# Patient Record
Sex: Male | Born: 1960
Health system: Southern US, Community
[De-identification: ages and names within clinical notes are randomized; demographics above are authoritative.]

## PROBLEM LIST (undated history)

## (undated) DIAGNOSIS — T7840XA Allergy, unspecified, initial encounter: Secondary | ICD-10-CM

## (undated) HISTORY — DX: Allergy, unspecified, initial encounter: T78.40XA

## (undated) HISTORY — PX: CYST REMOVAL TRUNK: SHX6283

---

## 2000-12-25 ENCOUNTER — Ambulatory Visit (HOSPITAL_BASED_OUTPATIENT_CLINIC_OR_DEPARTMENT_OTHER): Admission: RE | Admit: 2000-12-25 | Discharge: 2000-12-25 | Payer: Self-pay | Admitting: *Deleted

## 2009-04-14 ENCOUNTER — Ambulatory Visit (HOSPITAL_COMMUNITY): Admission: RE | Admit: 2009-04-14 | Discharge: 2009-04-14 | Payer: Self-pay | Admitting: Family Medicine

## 2010-09-01 NOTE — Op Note (Signed)
[place]. [facility]  Patient:    CAMEO, SHEWELL Visit Number: [id number] MRN: [id number]          Service Type: DSU Location: [facility] Attending Physician:  Kandis Mannan Proc. Date: 12/25/00 Admit Date:  12/25/2000   CC:         Jethro Bastos, M.D.   Operative Report  CCS NUMBER:  [id number]  PREOPERATIVE DIAGNOSIS:  Perirectal abscess.  POSTOPERATIVE DIAGNOSIS:  Perirectal abscess.  OPERATION:  Incision and drainage with packing of perirectal abscess.  SURGEON:  Maisie Fus B. Samuella Cota, M.D.  ANESTHESIA:  General.  ANESTHESIOLOGIST:  Janetta Hora. Gelene Mink, M.D. and CRNA.  DESCRIPTION OF PROCEDURE:  The patient was taken to the operating room and placed on the table in the supine position.  After satisfactory general anesthetic with LMA intubation, the patient was placed in lithotomy position, and the perianal area was shaved and then prepped with Betadine.  Sterile drapes were applied.  The patient had a swollen area at the 11 oclock position, and pressure over this produced some drainage through an opening at the 11 oclock position.  The anoscope was inserted.  The internal opening was identified.  A radial incision was made over the abscess cavity and once the abscess cavity was entered, a hemostat could be placed through the cavity through the internal opening.  The tract was opened completely.  The edges were freshened up to get back to normal looking tissue around the edge of the abscess.  The abscess cavity extended distally down the leg at about the 11:30 position.  The patient had some granulation tissue in the abscess cavity, and this was removed.  The wound was irrigated.  Bleeding was controlled with the cautery.  The wound was packed open with quarter-inch Iodoform packing.  Then 4 x 4s, ABD, and four-inch Hypafix was applied.  The patient seemed to tolerate the procedure well.  The internal opening seemed to be superficial to the  sphincter muscles, and no sphincter muscles were divided.  The patient seemed to tolerate the procedure well and was taken to the PACU in satisfactory condition. Attending Physician:  Kandis Mannan DD:  12/25/00 TD:  12/25/00 Job: [id number]

## 2010-12-15 IMAGING — CR DG ORBITS FOR FOREIGN BODY
2 series · 2 of 2 positions shown · non-contrast
Comparison: None

CLINICAL DATA: History metal in orbits.  Pre MRI screening.

ORBITS FOR FOREIGN BODY - 2 VIEW

[w waters * (1 of 2)]
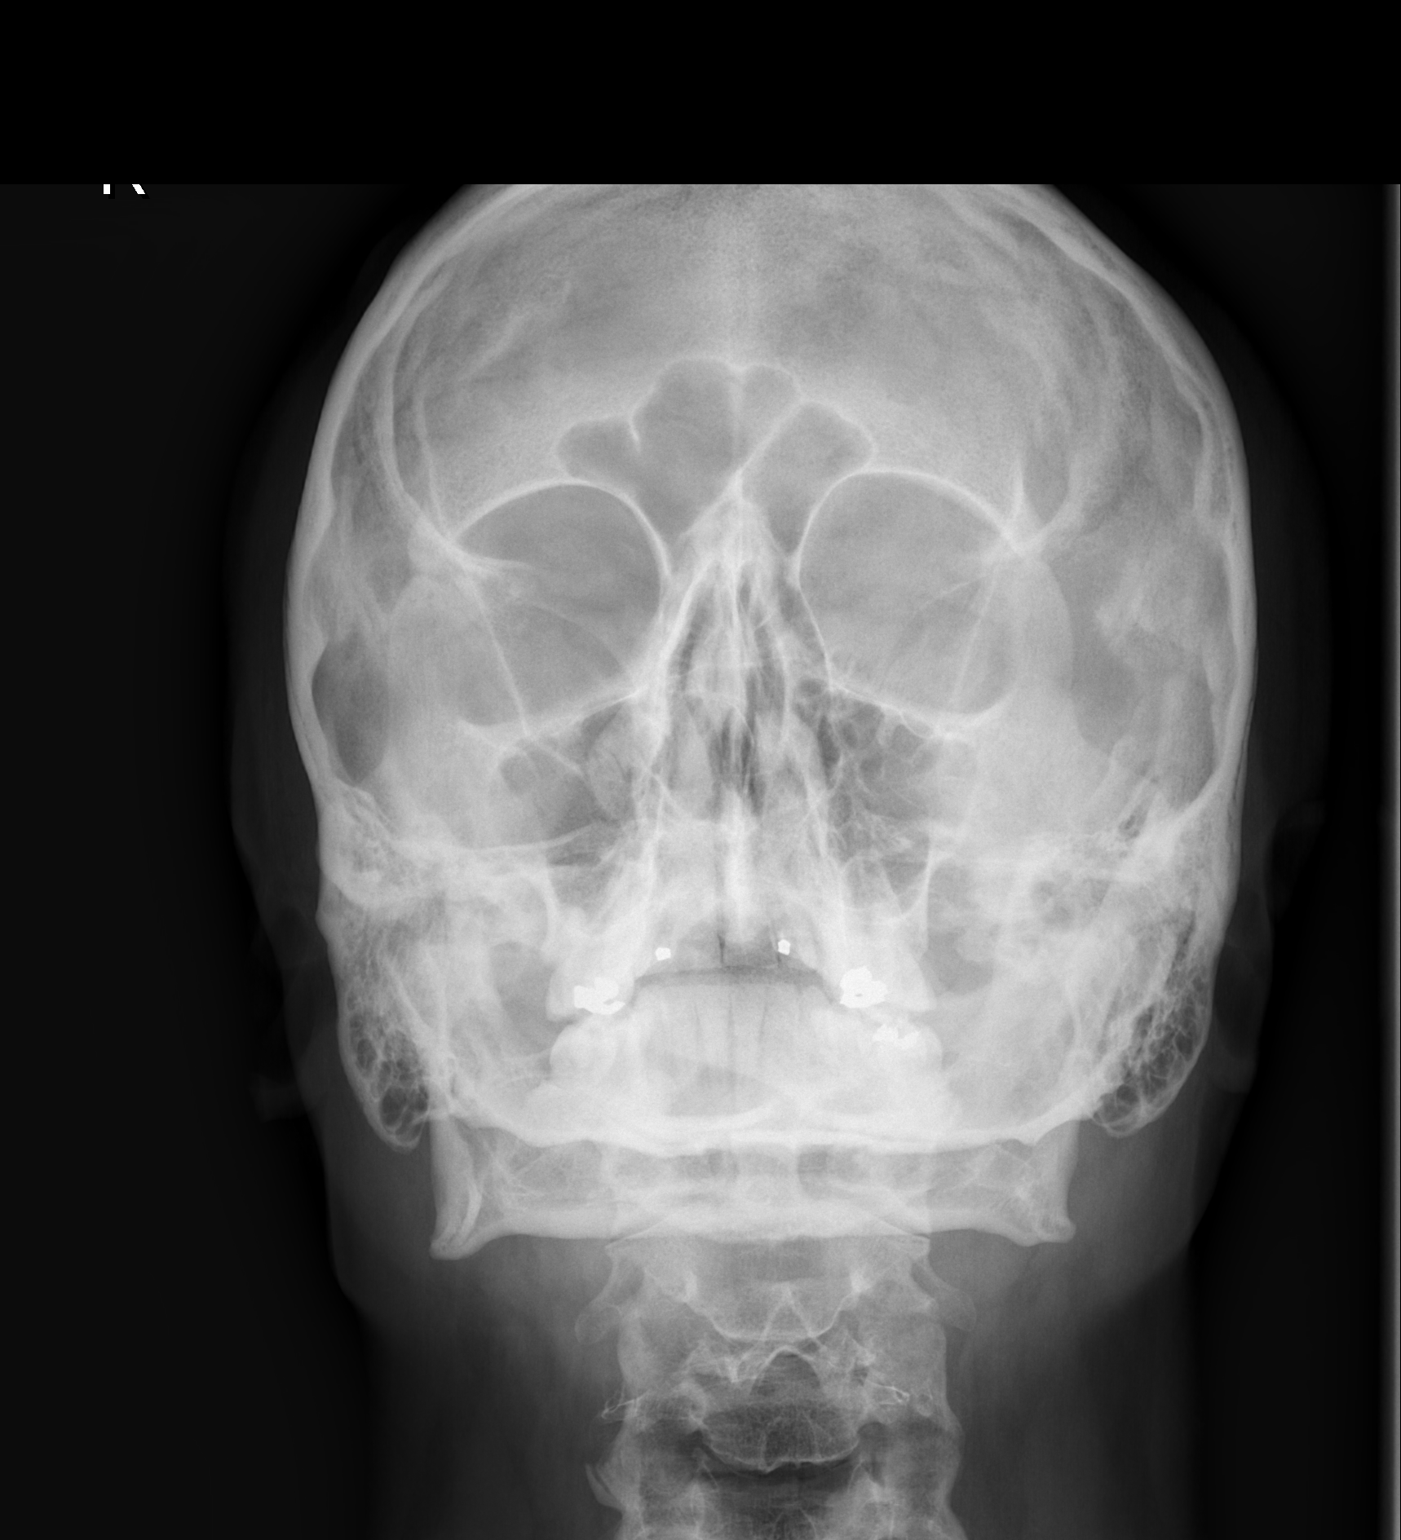

[w waters * (2 of 2)]
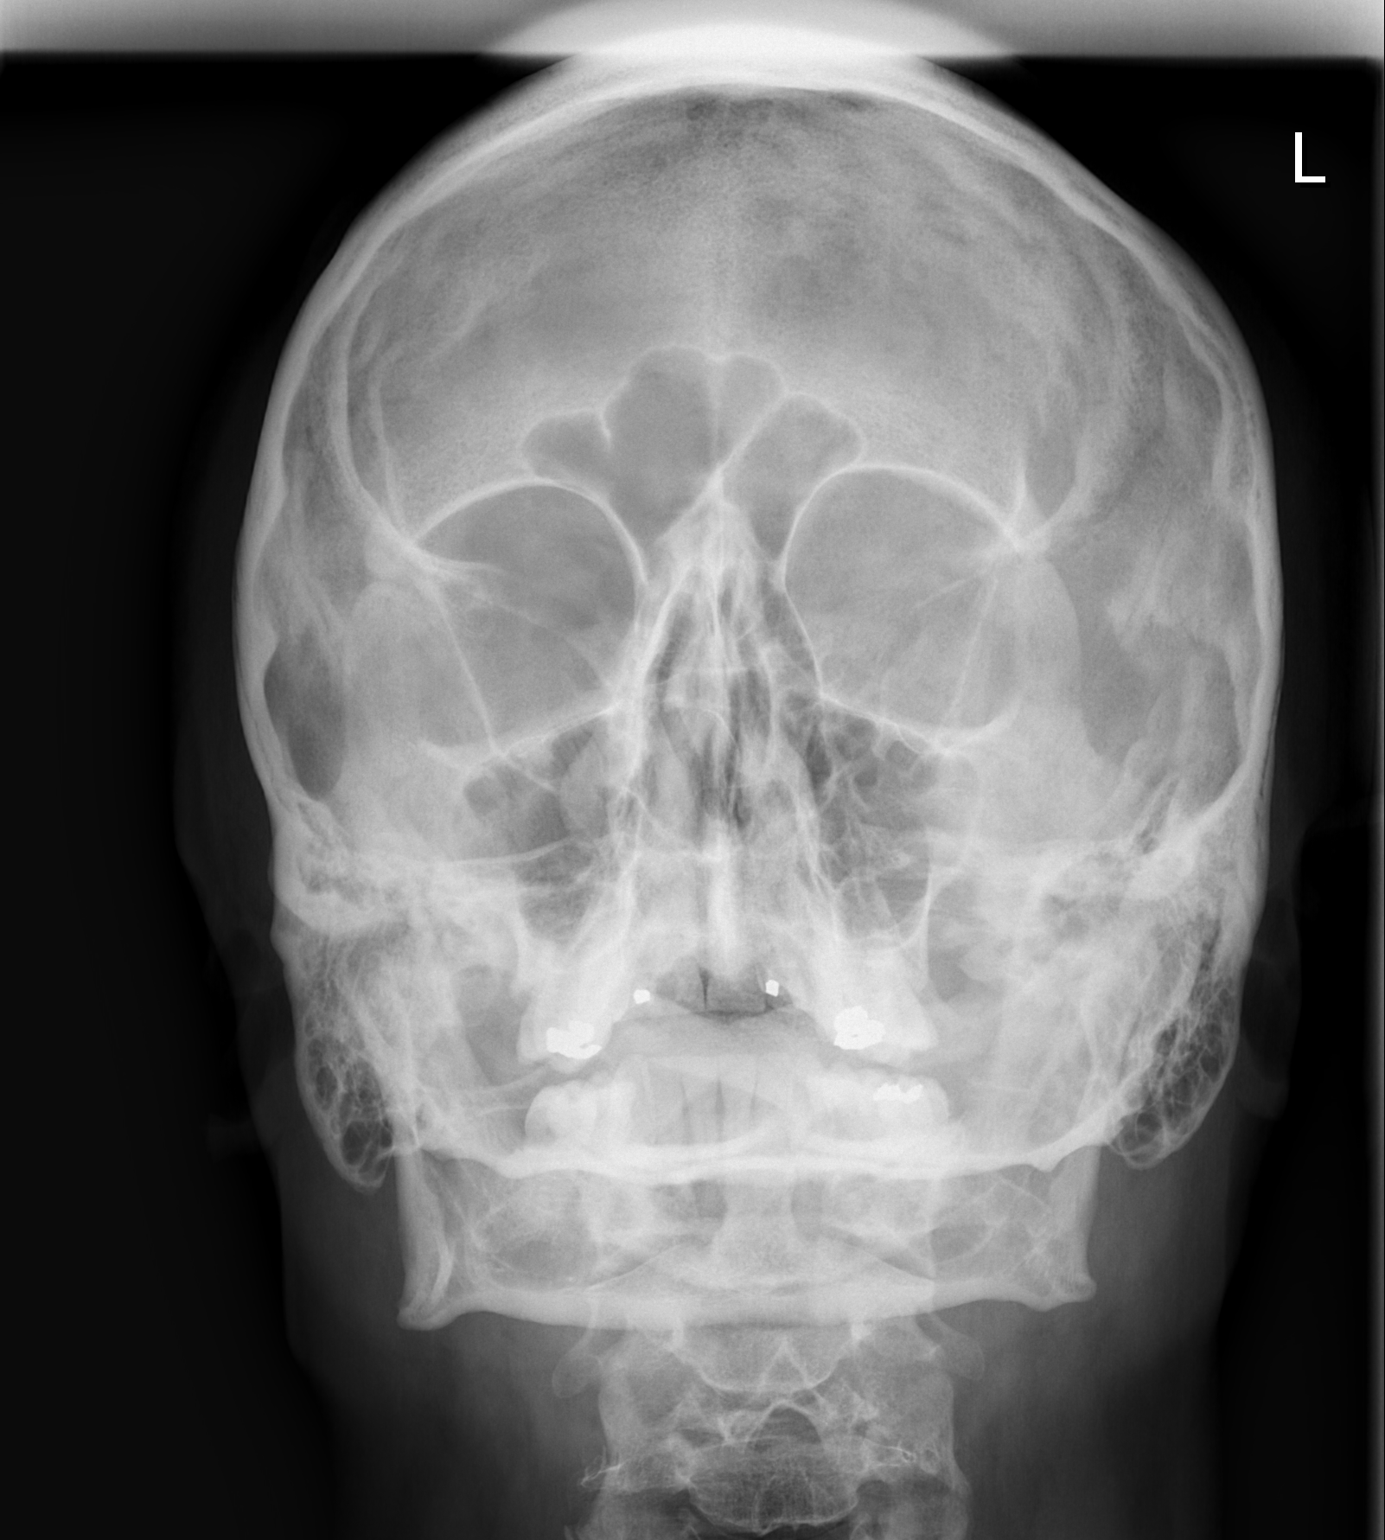

[2 of 2 positions shown; findings below may reference images not displayed]

FINDINGS: No metallic orbital foreign body is appreciated.
IMPRESSION: Negative for metallic orbital foreign material.

## 2016-02-14 ENCOUNTER — Encounter: Payer: Self-pay | Admitting: *Deleted

## 2017-09-13 DIAGNOSIS — R972 Elevated prostate specific antigen [PSA]: Secondary | ICD-10-CM | POA: Diagnosis not present

## 2017-09-27 DIAGNOSIS — Z23 Encounter for immunization: Secondary | ICD-10-CM | POA: Diagnosis not present

## 2018-02-14 DIAGNOSIS — Z23 Encounter for immunization: Secondary | ICD-10-CM | POA: Diagnosis not present

## 2018-04-03 DIAGNOSIS — E785 Hyperlipidemia, unspecified: Secondary | ICD-10-CM | POA: Diagnosis not present

## 2018-04-03 DIAGNOSIS — Z0000 Encounter for general adult medical examination without abnormal findings: Secondary | ICD-10-CM | POA: Diagnosis not present

## 2018-04-03 DIAGNOSIS — Z125 Encounter for screening for malignant neoplasm of prostate: Secondary | ICD-10-CM | POA: Diagnosis not present

## 2018-04-18 DIAGNOSIS — Z125 Encounter for screening for malignant neoplasm of prostate: Secondary | ICD-10-CM | POA: Diagnosis not present

## 2018-04-18 DIAGNOSIS — Z0000 Encounter for general adult medical examination without abnormal findings: Secondary | ICD-10-CM | POA: Diagnosis not present

## 2018-04-18 DIAGNOSIS — E785 Hyperlipidemia, unspecified: Secondary | ICD-10-CM | POA: Diagnosis not present

## 2019-01-09 DIAGNOSIS — R0609 Other forms of dyspnea: Secondary | ICD-10-CM | POA: Diagnosis not present

## 2019-01-16 ENCOUNTER — Telehealth: Payer: Self-pay

## 2019-01-16 DIAGNOSIS — D649 Anemia, unspecified: Secondary | ICD-10-CM | POA: Diagnosis not present

## 2019-01-16 DIAGNOSIS — R0609 Other forms of dyspnea: Secondary | ICD-10-CM | POA: Diagnosis not present

## 2019-01-16 NOTE — Telephone Encounter (Signed)
NOTES ON FILE FROM DR London Pepper [phone], REFERRAL SENT TO SCHEDULING

## 2019-02-05 ENCOUNTER — Ambulatory Visit: Payer: BC Managed Care – PPO | Admitting: Cardiovascular Disease

## 2019-02-09 NOTE — Progress Notes (Signed)
Cardiology Office Note:   Date:  02/13/2019  NAME:  Ronald BucklerWilliam A Harper    MRN: [id number] DOB:  January 20, 1961   PCP:  Farris HasMorrow, Aaron, MD  Cardiologist:  No primary care provider on file.   Referring MD: Farris HasMorrow, Aaron, MD   Chief Complaint  Patient presents with  . Shortness of Breath   History of Present Illness:   Ronald Harper is a 58 y.o. male with a hx of seasonal allergies who is being seen today for the evaluation of shortness of breath/palpitations at the request of Farris HasMorrow, Aaron, MD.  He presents for evaluations of 18 months of exertional shortness of breath and palpitations.  He reports that he works for [employer] and has a part-time job Writercoaching baseball at [employer].  He reports he noticed nearly a year and a half ago that with exertion he would get short of breath and dizzy.  He states that his symptoms improved with cessation of activity.  He reports any heavy exertion can bring on his symptoms of shortness of breath and dizziness.  Again they resolve without any major intervention other than cessation of activity.  He denies any chest pain or chest pressure.  He reports he has no real medical problems and takes no chronic medications.  He does report that he used to smoke but quit some 20 years ago.  He smoked for roughly 20 years.  He has no history of diabetes does not excessively consume alcohol.  He does have a history of coronary artery disease in his mother.  He has never had any frank syncope with this.  He does report he can get palpitations intermittently without exercise, but most of his symptoms occur with heavy exertion.  Past Medical History: Past Medical History:  Diagnosis Date  . Allergies     Past Surgical History: Past Surgical History:  Procedure Laterality Date  . CYST REMOVAL TRUNK      Current Medications: Current Meds  Medication Sig  . Ascorbic Acid (VITAMIN C WITH ROSE HIPS) 1000 MG tablet Take 1,000 mg by mouth daily.  .  Bilberry 100 MG CAPS Take by mouth.  . Coenzyme Q10 (CO Q-10) 100 MG CAPS Take by mouth.  . Cyanocobalamin (VITAMIN B 12 PO) Take by mouth.  . Ferrous Sulfate (IRON PO) Take by mouth.  . Ginkgo Biloba (GINKOBA PO) Take by mouth.  Marland Kitchen. KRILL OIL PO Take by mouth.  . Pediatric Multivit-Minerals-C (SMARTY PANTS KIDS COMPLETE) CHEW Chew by mouth.  . Probiotic Product (TRUNATURE PROBIOTIC FOR KIDS) CHEW Chew by mouth.  . vitamin E 400 UNIT capsule Take 400 Units by mouth daily.     Allergies:    Penicillins   Social History: Social History   Socioeconomic History  . Marital status: Married    Spouse name: Not on file  . Number of children: 1  . Years of education: Not on file  . Highest education level: Not on file  Occupational History  . Not on file  Social Needs  . Financial resource strain: Not on file  . Food insecurity    Worry: Not on file    Inability: Not on file  . Transportation needs    Medical: Not on file    Non-medical: Not on file  Tobacco Use  . Smoking status: Former Smoker    Years: 15.00  . Smokeless tobacco: Never Used  Substance and Sexual Activity  . Alcohol use: Not Currently  . Drug use:  Not on file  . Sexual activity: Not on file  Lifestyle  . Physical activity    Days per week: Not on file    Minutes per session: Not on file  . Stress: Not on file  Relationships  . Social Herbalist on phone: Not on file    Gets together: Not on file    Attends religious service: Not on file    Active member of club or organization: Not on file    Attends meetings of clubs or organizations: Not on file    Relationship status: Not on file  Other Topics Concern  . Not on file  Social History Narrative   Works as Leisure centre manager for [employer]    Family History: The patient's family history includes Diabetes in his brother and father; Heart attack in his maternal grandfather, maternal grandmother, and mother.  ROS:    All other ROS reviewed and negative. Pertinent positives noted in the HPI.     EKGs/Labs/Other Studies Reviewed:   The following studies were personally reviewed by me today:  Labs reviewed from primary care physician office: Total cholesterol 172, HDL 48, LDL 107, triglycerides 83, hemoglobin 10.4, serum creatinine 1.05, TSH 1.14  EKG:  EKG is ordered today.  The ekg ordered today demonstrates normal sinus rhythm, heart rate 80, no acute ST-T changes, no evidence of prior infarction, and was personally reviewed by me.   Recent Labs: No results found for requested labs within last 8760 hours.   Recent Lipid Panel No results found for: CHOL, TRIG, HDL, CHOLHDL, VLDL, LDLCALC, LDLDIRECT  Physical Exam:   VS:  BP 117/80 (BP Location: Left Arm)   Pulse 85   Temp (!) 97.3 F (36.3 C)   Ht 6' (1.829 m)   Wt 209 lb 3.2 oz (94.9 kg)   SpO2 96%   BMI 28.37 kg/m    Wt Readings from Last 3 Encounters:  02/13/19 209 lb 3.2 oz (94.9 kg)    General: Well nourished, well developed, in no acute distress Heart: Atraumatic, normal size  Eyes: PEERLA, EOMI  Neck: Supple, no JVD Endocrine: No thryomegaly Cardiac: Normal S1, S2; RRR; no murmurs, rubs, or gallops Lungs: Clear to auscultation bilaterally, no wheezing, rhonchi or rales  Abd: Soft, nontender, no hepatomegaly  Ext: No edema, pulses 2+ Musculoskeletal: No deformities, BUE and BLE strength normal and equal Skin: Warm and dry, no rashes   Neuro: Alert and oriented to person, place, time, and situation, CNII-XII grossly intact, no focal deficits  Psych: Normal mood and affect   ASSESSMENT:   [name] is a 58 y.o. male who presents for the following: 1. SOB (shortness of breath) on exertion   2. Palpitations     PLAN:   1. SOB (shortness of breath) on exertion 2. Palpitations -He presents with nearly 18 months of exertional shortness of breath and palpitations.  It is really alarming the rate of his symptoms.  He  states that any heavy exertion can bring these on.  I did review the lab work from his primary care physician and the only really major issue is an anemia that seems to be iron deficiency.  His hemoglobin was 10.4, TSH 1.1, LDL cholesterol 107.  He has no history of diabetes and is blood glucose was within normal limits.  I think it is reasonable given his smoking history and history of CAD to proceed with a stress evaluation.  I would  prefer to do an exercise treadmill test given his normal EKG however due to the coronavirus pandemic that will be difficult given his work schedule.  We will therefore proceed with a Lexiscan nuclear medicine stress test at this office.  Will also obtain echocardiogram to evaluate his LV function as nuclear medicine notoriously underestimates ejection fraction. -We will also obtain a 48-hour Holter monitor to assess for any underlying arrhythmias.  His EKG shows normal sinus rhythm without any obvious signs of arrhythmia or conduction disease.  However given his smoking history this merits evaluation. -We will see him back in 1 month after the above evaluation  Disposition: Return in about 1 month (around 03/16/2019).  Medication Adjustments/Labs and Tests Ordered: Current medicines are reviewed at length with the patient today.  Concerns regarding medicines are outlined above.  Orders Placed This Encounter  Procedures  . MYOCARDIAL PERFUSION IMAGING  . HOLTER MONITOR - 48 HOUR  . EKG 12-Lead  . ECHOCARDIOGRAM COMPLETE   No orders of the defined types were placed in this encounter.   Patient Instructions  Medication Instructions:  NO CHANGES    Lab Work: NOT NEEDED   Testing/Procedures:  will be schedule at [address]  Your physician has requested that you have an echocardiogram. Echocardiography is a painless test that uses sound waves to create images of your heart. It provides your doctor with information about the size and  shape of your heart and how well your heart's chambers and valves are working. This procedure takes approximately one hour. There are no restrictions for this procedure. AND Your physician has recommended that you wear a holter monitor - 48 HOURS. Holter monitors are medical devices that record the heart's electrical activity. Doctors most often use these monitors to diagnose arrhythmias. Arrhythmias are problems with the speed or rhythm of the heartbeat. The monitor is a small, portable device. You can wear one while you do your normal daily activities. This is usually used to diagnose what is causing palpitations/syncope (passing out).     WILL BE SCHEDULE AT [address] Your physician has requested that you have a lexiscan myoview. For further information please visit [email or web address] Please follow instruction sheet, as given.    Follow-Up: At [facility], you and your health needs are our priority.  As part of our continuing mission to provide you with exceptional heart care, we have created designated Provider Care Teams.  These Care Teams include your primary Cardiologist (physician) and Advanced Practice Providers (APPs -  Physician Assistants and Nurse Practitioners) who all work together to provide you with the care you need, when you need it.  Your next appointment:   1 MONTH  The format for your next appointment:   In Person  Provider:   Lennie Odor, MD  Other Instructions     Signed, Lenna Gilford. Flora Lipps, MD [facility]  [address] [place], [place] [address] [phone]  02/13/2019 12:30 PM

## 2019-02-13 ENCOUNTER — Encounter: Payer: Self-pay | Admitting: Cardiovascular Disease

## 2019-02-13 ENCOUNTER — Ambulatory Visit: Payer: BC Managed Care – PPO | Admitting: Cardiovascular Disease

## 2019-02-13 ENCOUNTER — Other Ambulatory Visit: Payer: Self-pay

## 2019-02-13 VITALS — BP 117/80 | HR 85 | Temp 97.3°F | Ht 72.0 in | Wt 209.2 lb

## 2019-02-13 DIAGNOSIS — R002 Palpitations: Secondary | ICD-10-CM

## 2019-02-13 DIAGNOSIS — R0602 Shortness of breath: Secondary | ICD-10-CM | POA: Diagnosis not present

## 2019-02-13 NOTE — Patient Instructions (Signed)
Medication Instructions:  NO CHANGES    Lab Work: NOT NEEDED   Testing/Procedures:  will be schedule at [place]  Your physician has requested that you have an echocardiogram. Echocardiography is a painless test that uses sound waves to create images of your heart. It provides your doctor with information about the size and shape of your heart and how well your heart's chambers and valves are working. This procedure takes approximately one hour. There are no restrictions for this procedure. AND Your physician has recommended that you wear a holter monitor - 48 HOURS. Holter monitors are medical devices that record the heart's electrical activity. Doctors most often use these monitors to diagnose arrhythmias. Arrhythmias are problems with the speed or rhythm of the heartbeat. The monitor is a small, portable device. You can wear one while you do your normal daily activities. This is usually used to diagnose what is causing palpitations/syncope (passing out).     WILL BE SCHEDULE AT [facility] Your physician has requested that you have a lexiscan myoview. For further information please visit [email or web address]. Please follow instruction sheet, as given.    Follow-Up: At [facility], you and your health needs are our priority.  As part of our continuing mission to provide you with exceptional heart care, we have created designated Provider Care Teams.  These Care Teams include your primary Cardiologist (physician) and Advanced Practice Providers (APPs -  Physician Assistants and Nurse Practitioners) who all work together to provide you with the care you need, when you need it.  Your next appointment:   1 MONTH  The format for your next appointment:   In Person  Provider:   Eleonore Chiquito, MD  Other Instructions

## 2019-02-20 DIAGNOSIS — D649 Anemia, unspecified: Secondary | ICD-10-CM | POA: Diagnosis not present

## 2019-02-25 ENCOUNTER — Telehealth (HOSPITAL_COMMUNITY): Payer: Self-pay

## 2019-02-25 NOTE — Telephone Encounter (Signed)
Encounter complete. 

## 2019-02-26 ENCOUNTER — Telehealth: Payer: Self-pay | Admitting: *Deleted

## 2019-02-26 ENCOUNTER — Telehealth (HOSPITAL_COMMUNITY): Payer: Self-pay

## 2019-02-26 NOTE — Telephone Encounter (Signed)
[phone] message  Call cannot be completed as dialed.  Attempted several times. Attempting to contact patient to set up holter monitor ordered by Dr. Marisue Ivan.

## 2019-02-26 NOTE — Telephone Encounter (Signed)
Encounter complete. 

## 2019-02-27 ENCOUNTER — Other Ambulatory Visit: Payer: Self-pay

## 2019-02-27 ENCOUNTER — Ambulatory Visit (HOSPITAL_BASED_OUTPATIENT_CLINIC_OR_DEPARTMENT_OTHER): Payer: BC Managed Care – PPO

## 2019-02-27 ENCOUNTER — Encounter: Payer: Self-pay | Admitting: Internal Medicine

## 2019-02-27 ENCOUNTER — Ambulatory Visit (HOSPITAL_COMMUNITY)
Admission: RE | Admit: 2019-02-27 | Discharge: 2019-02-27 | Disposition: A | Discharge status: Home / Self Care | Payer: BC Managed Care – PPO | Source: Ambulatory Visit | Attending: Internal Medicine | Admitting: Internal Medicine

## 2019-02-27 DIAGNOSIS — R002 Palpitations: Secondary | ICD-10-CM

## 2019-02-27 DIAGNOSIS — R0602 Shortness of breath: Secondary | ICD-10-CM | POA: Diagnosis not present

## 2019-03-03 ENCOUNTER — Telehealth (HOSPITAL_COMMUNITY): Payer: Self-pay

## 2019-03-03 NOTE — Telephone Encounter (Signed)
Phone now working.  Encounter complete.

## 2019-03-04 ENCOUNTER — Telehealth: Payer: Self-pay | Admitting: *Deleted

## 2019-03-04 NOTE — Telephone Encounter (Signed)
Left message for patient to call and reschedule follow up appointment with Dr. Audie Box on 03/06/19 at 8:40 am to another date per staff message from Dr. Audie Box

## 2019-03-05 ENCOUNTER — Telehealth (HOSPITAL_COMMUNITY): Payer: Self-pay

## 2019-03-05 NOTE — Telephone Encounter (Signed)
Encounter complete. 

## 2019-03-06 ENCOUNTER — Ambulatory Visit (HOSPITAL_COMMUNITY)
Admission: RE | Admit: 2019-03-06 | Discharge: 2019-03-06 | Disposition: A | Discharge status: Home / Self Care | Payer: BC Managed Care – PPO | Source: Ambulatory Visit | Attending: Cardiology | Admitting: Cardiology

## 2019-03-06 ENCOUNTER — Other Ambulatory Visit: Payer: Self-pay

## 2019-03-06 ENCOUNTER — Ambulatory Visit: Payer: BC Managed Care – PPO | Admitting: Cardiovascular Disease

## 2019-03-06 DIAGNOSIS — R0602 Shortness of breath: Secondary | ICD-10-CM

## 2019-03-06 LAB — MYOCARDIAL PERFUSION IMAGING
LV dias vol: 107 mL (ref 62–150)
LV sys vol: 50 mL
Peak HR: 114 {beats}/min
Rest HR: 75 {beats}/min
SDS: 0
SRS: 0
SSS: 0
TID: 1.11

## 2019-03-06 MED ORDER — TECHNETIUM TC 99M TETROFOSMIN IV KIT
30.1000 | PACK | Freq: Once | INTRAVENOUS | Status: AC | PRN
Start: 2019-03-06 — End: 2019-03-06
  Administered 2019-03-06: 30.1 via INTRAVENOUS
  Filled 2019-03-06: qty 31

## 2019-03-06 MED ORDER — REGADENOSON 0.4 MG/5ML IV SOLN
0.4000 mg | Freq: Once | INTRAVENOUS | Status: AC
  Administered 2019-03-06: 0.4 mg via INTRAVENOUS

## 2019-03-06 MED ORDER — TECHNETIUM TC 99M TETROFOSMIN IV KIT
10.7000 | PACK | Freq: Once | INTRAVENOUS | Status: AC | PRN
  Administered 2019-03-06: 10.7 via INTRAVENOUS
  Filled 2019-03-06: qty 11

## 2019-03-23 NOTE — Progress Notes (Signed)
Cardiology Office Note:   Date:  03/27/2019  NAME:  Ronald Harper    MRN: [id number] DOB:  11/27/1960   PCP:  London Pepper, MD  Cardiologist:  Evalina Field, MD   Referring MD: London Pepper, MD   Chief Complaint  Patient presents with  . Shortness of Breath   History of Present Illness:   Ronald Harper is a 58 y.o. male with a hx of allergies who presents for follow-up of shortness of breath/palpitations. Normal echo and normal stress test. Holter seems to not have been done.  He reports that his symptoms have resolved.  His primary care physician has found iron deficiency anemia.  With iron supplementation his symptoms have improved.  He reports he is back to exercising including lifting weights without any shortness of breath or chest pain.  He is not having any further palpitations either.  He reports he can walk up several flights of stairs without any limitations.  This is markedly improved from our last visit.  He has plans to have his iron checked today by his primary care physician laboratory data are very unremarkable.   Past Medical History: Past Medical History:  Diagnosis Date  . Allergies     Past Surgical History: Past Surgical History:  Procedure Laterality Date  . CYST REMOVAL TRUNK      Current Medications: Current Meds  Medication Sig  . Ascorbic Acid (VITAMIN C WITH ROSE HIPS) 1000 MG tablet Take 1,000 mg by mouth daily.  . Bilberry 100 MG CAPS Take by mouth.  . Coenzyme Q10 (CO Q-10) 100 MG CAPS Take by mouth.  . Cyanocobalamin (VITAMIN B 12 PO) Take by mouth.  . Ferrous Sulfate (IRON PO) Take by mouth.  . Ginkgo Biloba (GINKOBA PO) Take by mouth.  Marland Kitchen KRILL OIL PO Take by mouth.  . Pediatric Multivit-Minerals-C (SMARTY PANTS KIDS COMPLETE) CHEW Chew by mouth.  . Probiotic Product (TRUNATURE PROBIOTIC FOR KIDS) CHEW Chew by mouth.  . vitamin E 400 UNIT capsule Take 400 Units by mouth daily.     Allergies:    Penicillins   Social  History: Social History   Socioeconomic History  . Marital status: Married    Spouse name: Not on file  . Number of children: 1  . Years of education: Not on file  . Highest education level: Not on file  Occupational History  . Not on file  Tobacco Use  . Smoking status: Former Smoker    Years: 15.00  . Smokeless tobacco: Never Used  Substance and Sexual Activity  . Alcohol use: Not Currently  . Drug use: Not on file  . Sexual activity: Not on file  Other Topics Concern  . 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 Difficulty of Paying Living Expenses: Not on file  Food Insecurity:   . Worried About Charity fundraiser in the Last Year: Not on file  . Ran Out of Food in the Last Year: Not on file  Transportation Needs:   . Lack of Transportation (Medical): Not on file  . Lack of Transportation (Non-Medical): Not on file  Physical Activity:   . Days of Exercise per Week: Not on file  . Minutes of Exercise per Session: Not on file  Stress:   . Feeling of Stress : Not on file  Social  Connections:   . Frequency of Communication with Friends and Family: Not on file  . Frequency of Social Gatherings with Friends and Family: Not on file  . Attends Religious Services: Not on file  . Active Member of Clubs or Organizations: Not on file  . Attends Banker Meetings: Not on file  . Marital Status: Not on file     Family History: The patient's family history includes Diabetes in his brother and father; Heart attack in his maternal grandfather, maternal grandmother, and mother.  ROS:   All other ROS reviewed and negative. Pertinent positives noted in the HPI.     EKGs/Labs/Other Studies Reviewed:   The following studies were personally reviewed by me today:  TTE 02/27/2019  1. Left ventricular ejection fraction, by visual estimation,  is 55 to 60%. The left ventricle has normal function. There is borderline left ventricular hypertrophy.  2. The left ventricle has no regional wall motion abnormalities.  3. Global right ventricle has normal systolic function.The right ventricular size is normal. No increase in right ventricular wall thickness.  4. Left atrial size was normal.  5. Right atrial size was normal.  6. The mitral valve is grossly normal. Trace mitral valve regurgitation.  7. The tricuspid valve is grossly normal. Tricuspid valve regurgitation is trivial.  8. The aortic valve is tricuspid. Aortic valve regurgitation is not visualized. No evidence of aortic valve sclerosis or stenosis.  9. The pulmonic valve was grossly normal. Pulmonic valve regurgitation is trivial. 10. Normal pulmonary artery systolic pressure. 11. The tricuspid regurgitant velocity is 2.51 m/s, and with an assumed right atrial pressure of 3 mmHg, the estimated right ventricular systolic pressure is normal at 28.2 mmHg. 12. The inferior vena cava is normal in size with greater than 50% respiratory variability, suggesting right atrial pressure of 3 mmHg.  NM MPI 03/06/2019  The left ventricular ejection fraction is mildly decreased (45-54%).  Nuclear stress EF: 53%.  There was no ST segment deviation noted during stress.  No T wave inversion was noted during stress.  The study is normal.  This is a low risk study.     Recent Labs: No results found for requested labs within last 8760 hours.   Recent Lipid Panel No results found for: CHOL, TRIG, HDL, CHOLHDL, VLDL, LDLCALC, LDLDIRECT  Physical Exam:   VS:  BP (!) 145/82   Pulse 95   Ht 6' (1.829 m)   Wt 210 lb (95.3 kg)   SpO2 97%   BMI 28.48 kg/m    Wt Readings from Last 3 Encounters:  03/27/19 210 lb (95.3 kg)  03/06/19 209 lb (94.8 kg)  02/13/19 209 lb 3.2 oz (94.9 kg)    General: Well nourished, well developed, in no acute distress Heart: Atraumatic, normal size  Eyes:  PEERLA, EOMI  Neck: Supple, no JVD Endocrine: No thryomegaly Cardiac: Normal S1, S2; RRR; no murmurs, rubs, or gallops Lungs: Clear to auscultation bilaterally, no wheezing, rhonchi or rales  Abd: Soft, nontender, no hepatomegaly  Ext: No edema, pulses 2+ Musculoskeletal: No deformities, BUE and BLE strength normal and equal Skin: Warm and dry, no rashes   Neuro: Alert and oriented to person, place, time, and situation, CNII-XII grossly intact, no focal deficits  Psych: Normal mood and affect   ASSESSMENT:   AARYN SERMON is a 58 y.o. male who presents for the following: 1. SOB (shortness of breath) on exertion   2. Palpitations     PLAN:   1.  SOB (shortness of breath) on exertion 2. Palpitations -Normal echocardiogram and normal stress test.  He did not get his monitor but reports no further symptoms with iron supplementation.  Likely his symptoms were related to iron deficiency anemia.  Last colonoscopy 5 years ago.  I encouraged him to consider more aggressive work-up for this.  He will see his primary care physician today.  For now, given negative cardiac work-up.  We will see him on an as-needed basis.  Should he have further symptoms the last piece of his work-up would be heart monitor.  We could order this if his symptoms recur prior to him seeing us.  Disposition: Return if symptoms worsen or fail to improve.  Medication Adjustments/Labs and Tests Ordered: Current medicines are reviewed at length with the patient today.  Concerns regarding medicines are outlined above.  No orders of the defined types were placed in this encounter.  No orders of the defined types were placed in this encounter.   Patient Instructions  Your physician recommends that you continue on your current medications as directed. Please refer to the Current Medication list given to you today.   Your physician recommends that you schedule a follow-up appointment in:  AS NEEDED     Signed,  Lenna GilfordWesley T. Flora Lipps'Neal, MD [facility]  [facility]  [address] [place], [place] [address] [phone]  03/27/2019 9:22 AM

## 2019-03-26 NOTE — Telephone Encounter (Signed)
We were previously unable to contact patient regarding monitor.   Patient does not know if monitor is necessary anymore.  He is not have palpitations as before since started on iron.  Patient has follow up appointment with Dr. Audie Box 03/27/19 and will discuss with him.  If monitor is still needed we ask to be notified.

## 2019-03-27 ENCOUNTER — Encounter: Payer: Self-pay | Admitting: Cardiovascular Disease

## 2019-03-27 ENCOUNTER — Ambulatory Visit: Payer: BC Managed Care – PPO | Admitting: Cardiovascular Disease

## 2019-03-27 ENCOUNTER — Other Ambulatory Visit: Payer: Self-pay

## 2019-03-27 VITALS — BP 145/82 | HR 95 | Ht 72.0 in | Wt 210.0 lb

## 2019-03-27 DIAGNOSIS — R002 Palpitations: Secondary | ICD-10-CM | POA: Diagnosis not present

## 2019-03-27 DIAGNOSIS — R0602 Shortness of breath: Secondary | ICD-10-CM

## 2019-03-27 DIAGNOSIS — D649 Anemia, unspecified: Secondary | ICD-10-CM | POA: Diagnosis not present

## 2019-03-27 NOTE — Patient Instructions (Signed)
Your physician recommends that you continue on your current medications as directed. Please refer to the Current Medication list given to you today.    Your physician recommends that you schedule a follow-up appointment in:  AS NEEDED 

## 2019-05-29 DIAGNOSIS — Z0000 Encounter for general adult medical examination without abnormal findings: Secondary | ICD-10-CM | POA: Diagnosis not present

## 2019-06-12 DIAGNOSIS — D509 Iron deficiency anemia, unspecified: Secondary | ICD-10-CM | POA: Diagnosis not present

## 2019-06-12 DIAGNOSIS — Z0000 Encounter for general adult medical examination without abnormal findings: Secondary | ICD-10-CM | POA: Diagnosis not present

## 2019-06-12 DIAGNOSIS — E7800 Pure hypercholesterolemia, unspecified: Secondary | ICD-10-CM | POA: Diagnosis not present

## 2019-06-12 DIAGNOSIS — Z125 Encounter for screening for malignant neoplasm of prostate: Secondary | ICD-10-CM | POA: Diagnosis not present

## 2019-06-19 ENCOUNTER — Ambulatory Visit: Payer: BC Managed Care – PPO | Attending: Internal Medicine

## 2019-06-19 DIAGNOSIS — Z23 Encounter for immunization: Secondary | ICD-10-CM | POA: Insufficient documentation

## 2019-06-19 NOTE — Progress Notes (Signed)
   Covid-19 Vaccination Clinic  Name:  Ronald Harper    MRN: [id number] DOB: [birth year]  06/19/2019  Ronald Harper was observed post Covid-19 immunization for 15 minutes without incident. He was provided with Vaccine Information Sheet and instruction to access the V-Safe system.   Ronald Harper was instructed to call 911 with any severe reactions post vaccine: Marland Kitchen Difficulty breathing  . Swelling of face and throat  . A fast heartbeat  . A bad rash all over body  . Dizziness and weakness   Immunizations Administered    Name Date Dose VIS Date Route   Pfizer COVID-19 Vaccine 06/19/2019 11:13 AM 0.3 mL 03/27/2019 Intramuscular   Manufacturer: [employer], [employer]   Lot: [id number]   NDC: [address]-4

## 2019-07-03 DIAGNOSIS — H5213 Myopia, bilateral: Secondary | ICD-10-CM | POA: Diagnosis not present

## 2019-07-15 ENCOUNTER — Ambulatory Visit: Payer: BC Managed Care – PPO | Attending: Internal Medicine

## 2019-07-15 DIAGNOSIS — Z23 Encounter for immunization: Secondary | ICD-10-CM

## 2019-07-15 NOTE — Progress Notes (Signed)
   [facility] Vaccination [facility]  Name:  AMUN STEMM    MRN: [id number] DOB: 16-Sep-1960  07/15/2019  Mr. Laviolette was observed post Covid-19 immunization for 15 minutes without incident. He was provided with Vaccine Information Sheet and instruction to access the V-Safe system.   Mr. Rubi was instructed to call 911 with any severe reactions post vaccine: Marland Kitchen Difficulty breathing  . Swelling of face and throat  . A fast heartbeat  . A bad rash all over body  . Dizziness and weakness   Immunizations Administered    Name Date Dose VIS Date Route   Pfizer COVID-19 Vaccine 07/15/2019 11:01 AM 0.3 mL 03/27/2019 Intramuscular   Manufacturer: [employer], [employer]   Lot: [id number]   NDC: [address]-6

## 2019-10-16 DIAGNOSIS — E785 Hyperlipidemia, unspecified: Secondary | ICD-10-CM | POA: Diagnosis not present

## 2020-01-22 DIAGNOSIS — Z23 Encounter for immunization: Secondary | ICD-10-CM | POA: Diagnosis not present

## 2020-03-07 DIAGNOSIS — Z20822 Contact with and (suspected) exposure to covid-19: Secondary | ICD-10-CM | POA: Diagnosis not present

## 2020-07-08 DIAGNOSIS — Z125 Encounter for screening for malignant neoplasm of prostate: Secondary | ICD-10-CM | POA: Diagnosis not present

## 2020-07-08 DIAGNOSIS — D509 Iron deficiency anemia, unspecified: Secondary | ICD-10-CM | POA: Diagnosis not present

## 2020-07-08 DIAGNOSIS — E785 Hyperlipidemia, unspecified: Secondary | ICD-10-CM | POA: Diagnosis not present

## 2020-07-08 DIAGNOSIS — Z0000 Encounter for general adult medical examination without abnormal findings: Secondary | ICD-10-CM | POA: Diagnosis not present

## 2021-07-28 DIAGNOSIS — Z862 Personal history of diseases of the blood and blood-forming organs and certain disorders involving the immune mechanism: Secondary | ICD-10-CM | POA: Diagnosis not present

## 2021-07-28 DIAGNOSIS — Z125 Encounter for screening for malignant neoplasm of prostate: Secondary | ICD-10-CM | POA: Diagnosis not present

## 2021-07-28 DIAGNOSIS — E785 Hyperlipidemia, unspecified: Secondary | ICD-10-CM | POA: Diagnosis not present

## 2021-07-28 DIAGNOSIS — Z0000 Encounter for general adult medical examination without abnormal findings: Secondary | ICD-10-CM | POA: Diagnosis not present

## 2022-08-13 DIAGNOSIS — Z862 Personal history of diseases of the blood and blood-forming organs and certain disorders involving the immune mechanism: Secondary | ICD-10-CM | POA: Diagnosis not present

## 2022-08-13 DIAGNOSIS — Z125 Encounter for screening for malignant neoplasm of prostate: Secondary | ICD-10-CM | POA: Diagnosis not present

## 2022-08-13 DIAGNOSIS — Z0000 Encounter for general adult medical examination without abnormal findings: Secondary | ICD-10-CM | POA: Diagnosis not present

## 2022-08-13 DIAGNOSIS — E669 Obesity, unspecified: Secondary | ICD-10-CM | POA: Diagnosis not present

## 2022-08-13 DIAGNOSIS — E785 Hyperlipidemia, unspecified: Secondary | ICD-10-CM | POA: Diagnosis not present

## 2022-10-05 DIAGNOSIS — D751 Secondary polycythemia: Secondary | ICD-10-CM | POA: Diagnosis not present

## 2022-12-07 DIAGNOSIS — D751 Secondary polycythemia: Secondary | ICD-10-CM | POA: Diagnosis not present

## 2022-12-07 DIAGNOSIS — R972 Elevated prostate specific antigen [PSA]: Secondary | ICD-10-CM | POA: Diagnosis not present

## 2023-09-06 DIAGNOSIS — Z862 Personal history of diseases of the blood and blood-forming organs and certain disorders involving the immune mechanism: Secondary | ICD-10-CM | POA: Diagnosis not present

## 2023-09-06 DIAGNOSIS — Z125 Encounter for screening for malignant neoplasm of prostate: Secondary | ICD-10-CM | POA: Diagnosis not present

## 2023-09-06 DIAGNOSIS — E785 Hyperlipidemia, unspecified: Secondary | ICD-10-CM | POA: Diagnosis not present

## 2023-09-06 DIAGNOSIS — Z0000 Encounter for general adult medical examination without abnormal findings: Secondary | ICD-10-CM | POA: Diagnosis not present

## 2023-11-15 DIAGNOSIS — H52203 Unspecified astigmatism, bilateral: Secondary | ICD-10-CM | POA: Diagnosis not present

## 2023-11-15 DIAGNOSIS — H2513 Age-related nuclear cataract, bilateral: Secondary | ICD-10-CM | POA: Diagnosis not present
